# Patient Record
Sex: Female | Born: 1965 | Race: White | Hispanic: No
Health system: Southern US, Community
[De-identification: ages and names within clinical notes are randomized; demographics above are authoritative.]

---

## 2018-06-09 ENCOUNTER — Telehealth: Payer: Self-pay | Admitting: Nurse Practitioner

## 2018-06-09 ENCOUNTER — Other Ambulatory Visit: Payer: Self-pay | Admitting: Neurological Surgery

## 2018-06-09 DIAGNOSIS — M542 Cervicalgia: Secondary | ICD-10-CM

## 2018-06-09 NOTE — Telephone Encounter (Signed)
Phone call to patient to verify medication list and allergies for myelogram procedure. Pt instructed to stop Paxil 48hrs prior to myelogram appointment time. Pt verbalized understanding.

## 2018-06-23 ENCOUNTER — Ambulatory Visit
Admission: RE | Admit: 2018-06-23 | Discharge: 2018-06-23 | Disposition: A | Discharge status: Home / Self Care | Payer: Worker's Compensation | Source: Ambulatory Visit | Attending: Neurological Surgery | Admitting: Neurological Surgery

## 2018-06-23 DIAGNOSIS — M542 Cervicalgia: Secondary | ICD-10-CM

## 2018-06-23 MED ORDER — DIAZEPAM 5 MG PO TABS
5.0000 mg | ORAL_TABLET | Freq: Once | ORAL | Status: AC
  Administered 2018-06-23: 5 mg via ORAL

## 2018-06-23 MED ORDER — IOPAMIDOL (ISOVUE-M 300) INJECTION 61%
10.0000 mL | Freq: Once | INTRAMUSCULAR | Status: AC | PRN
  Administered 2018-06-23: 10 mL via INTRATHECAL

## 2018-06-23 MED ORDER — ACETAMINOPHEN 500 MG PO TABS
1000.0000 mg | ORAL_TABLET | Freq: Once | ORAL | Status: AC
  Administered 2018-06-23: 1000 mg via ORAL

## 2018-06-23 NOTE — Discharge Instructions (Signed)

## 2020-03-02 IMAGING — XA DG MYELOGRAPHY LUMBAR INJ CERVICAL
8 series · 8 of 8 positions shown · non-contrast
Comparison: CT cervical spine dated April 28, 2017. MRI cervical
spine dated March 11, 2017. There

CLINICAL DATA: Neck pain. Bilateral arm and hand weakness. Numbness
and tingling in both hands, possible carpal tunnel syndrome. Prior
C6-C7 disc replacement.
TECHNIQUE: Contiguous axial images were obtained through the cervical spine
after the intrathecal infusion of contrast. Coronal and sagittal
reconstructions were obtained of the axial image sets.

[Series 1: vasc standard · 1 of 1 slices shown (1 of 8)]
[im 1/1]
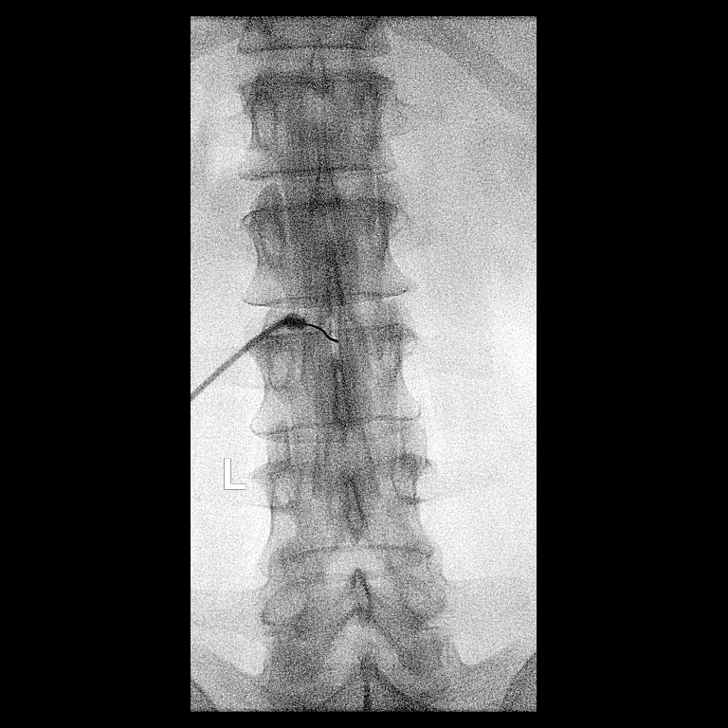

[Series 2: vasc standard · 1 of 1 slices shown (2 of 8)]
[im 1/1]
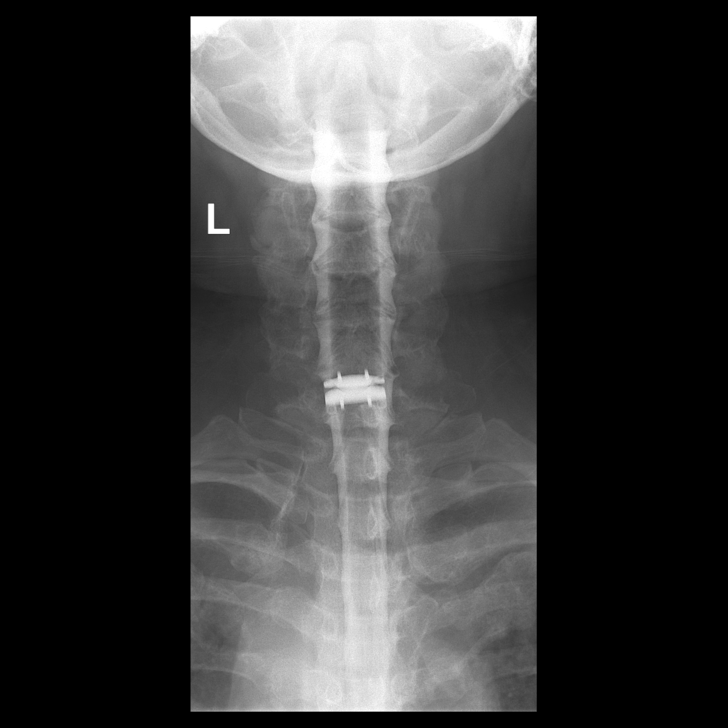

[Series 3: vasc standard · 1 of 1 slices shown (3 of 8)]
[im 1/1]
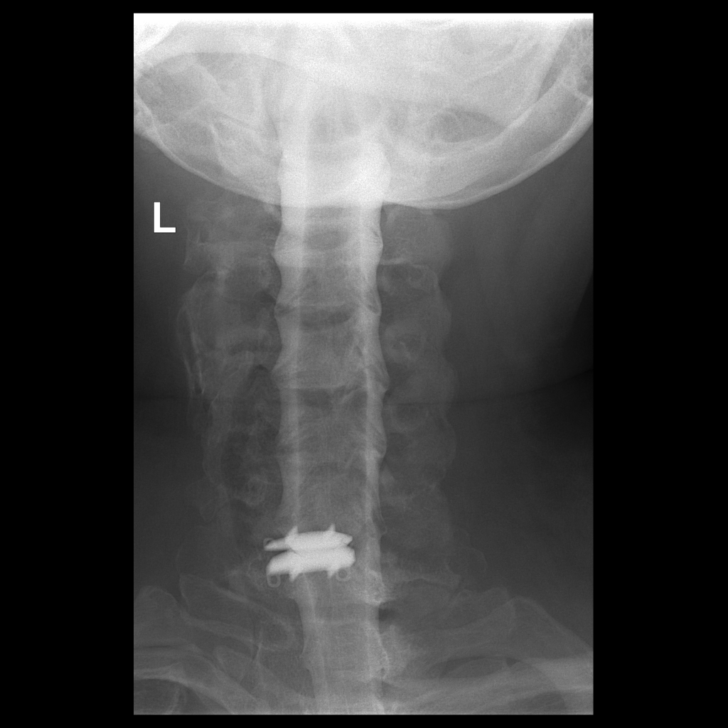

[Series 4: vasc standard · 1 of 1 slices shown (4 of 8)]
[im 1/1]
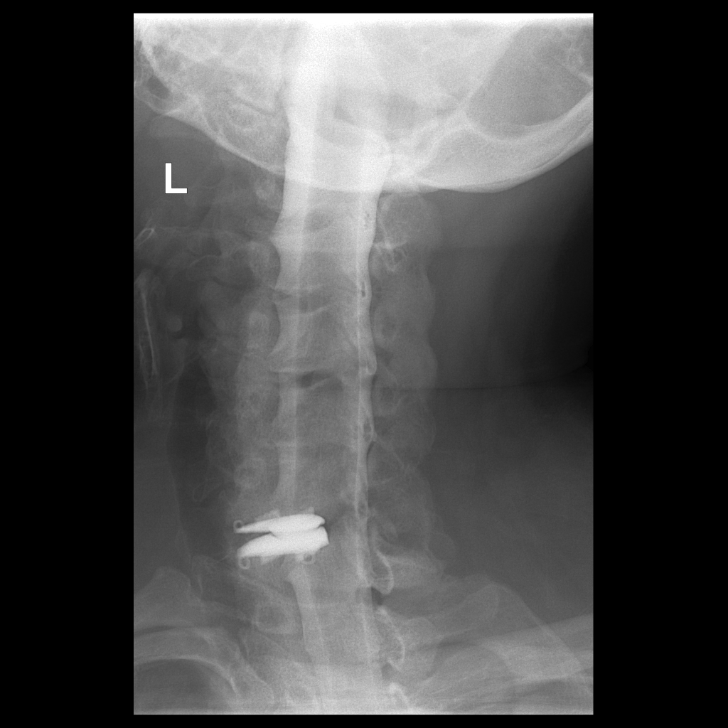

[Series 5: vasc standard · 1 of 1 slices shown (5 of 8)]
[im 1/1]
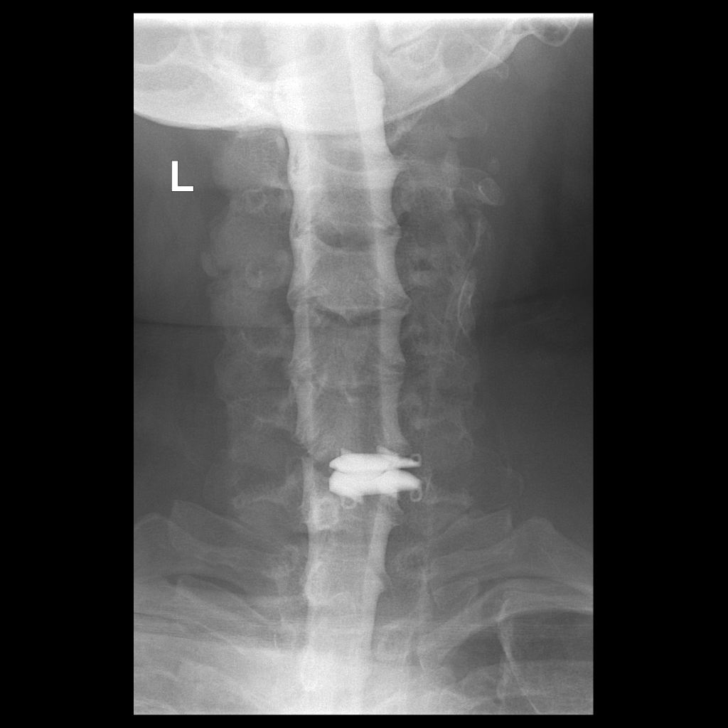

[Series 6: vasc standard · 1 of 1 slices shown (6 of 8)]
[im 1/1]
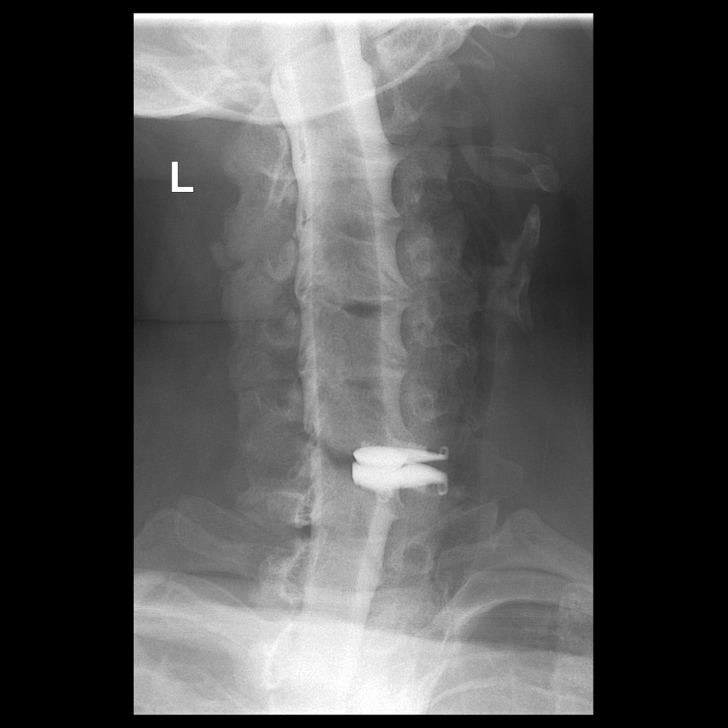

[Series 7: vasc standard · 1 of 1 slices shown (7 of 8)]
[im 1/1]
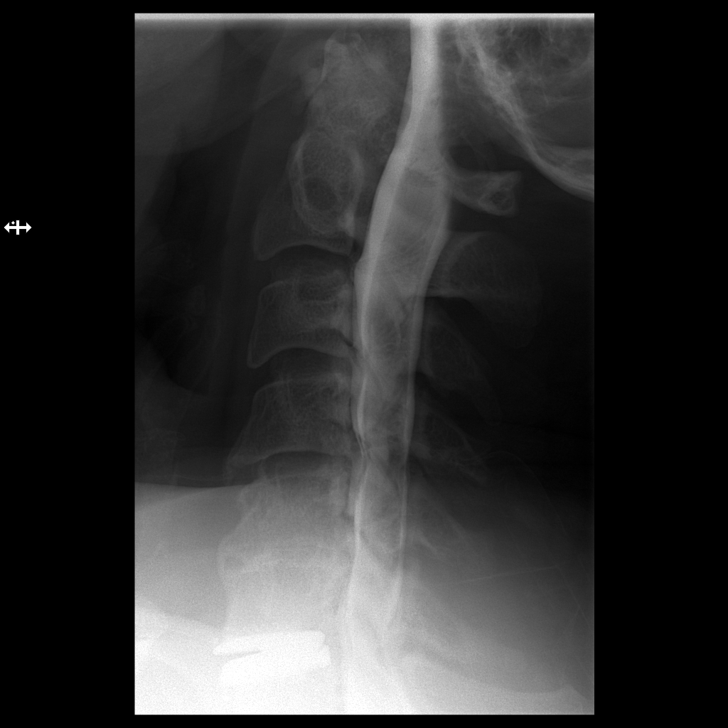

[Series 8: vasc standard · 1 of 1 slices shown (8 of 8)]
[im 1/1]
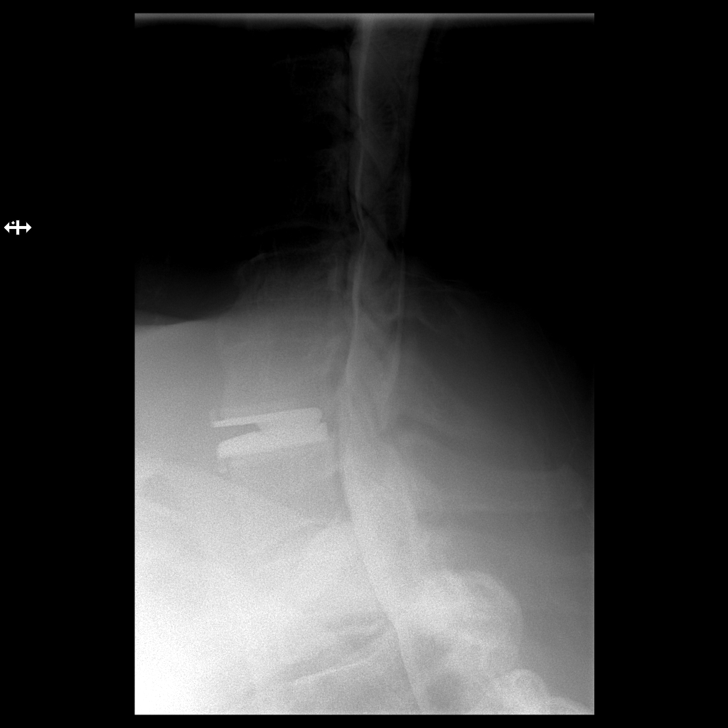

[8 of 8 positions shown; findings below may reference images not displayed]

EXAM:
CERVICAL MYELOGRAM

CT CERVICAL MYELOGRAM

FLUOROSCOPY TIME:  Radiation Exposure Index (as provided by the
fluoroscopic device): 5 mGy

Fluoroscopy Time:  16 seconds

Number of Acquired Images:  7

PROCEDURE:
LUMBAR PUNCTURE FOR CERVICAL MYELOGRAM

After thorough discussion of risks and benefits of the procedure
including bleeding, infection, injury to nerves, blood vessels,
adjacent structures as well as headache and CSF leak, written and
oral informed consent was obtained. Consent was obtained by Dr.
Phiil Tuelo. We discussed the high likelihood of obtaining a
diagnostic study.

Patient was positioned prone on the fluoroscopy table. Local
anesthesia was provided with 1% lidocaine without epinephrine after
prepped and draped in the usual sterile fashion. Puncture was
performed at L2-L3 using a 5 inch 22-gauge spinal needle via left
paramedian approach. Using a single pass through the dura, the
needle was placed within the thecal sac, with return of clear CSF.
10 mL of Isovue Y-LRR was injected into the thecal sac, with normal
opacification of the nerve roots and cauda equina consistent with
free flow within the subarachnoid space. The patient was then moved
to the trendelenburg position and contrast flowed into the cervical
spine region.

I personally performed the lumbar puncture and administered the
intrathecal contrast. I also personally supervised acquisition of
the myelogram images.
FINDINGS: CERVICAL MYELOGRAM FINDINGS:

Prior C6-C7 disc replacement. There is no acute fracture or
subluxation. Vertebral body heights are preserved. Alignment is
normal. Interval auto-fusion of the C5-C6 disc space. Mild disc
height loss and small ventral extradural defect at C4-C5. Mild
spinal canal stenosis at C4-C5 and C5-C6. Effacement of the exiting
left C6 nerve root. Normal prevertebral soft tissues.

CT CERVICAL MYELOGRAM FINDINGS:

Alignment: Slight reversal of the normal cervical lordosis at C4-C5,
unchanged. Sagittal alignment is maintained.

Skull base and vertebrae: No acute fracture or focal pathologic
process.

Cord: Normal in morphology.

Soft tissues and upper chest: Negative.

Disc levels:

C2-C3:  Negative.

C3-C4: Negative disc. Mild left facet uncovertebral hypertrophy.
Mild left neuroforaminal stenosis. No spinal canal or right
neuroforaminal stenosis.

C4-C5: Small posterior disc osteophyte complex and mild left greater
than right uncovertebral hypertrophy. Effacement of the ventral
thecal sac with slight flattening of the ventral cord and mild
central spinal canal stenosis. Mild left neuroforaminal stenosis. No
right neuroforaminal stenosis. Findings are similar to prior MRI.

C5-C6: Interval auto fusion of the disc space and uncovertebral
joints. Posterior endplate spurring and mild left facet arthropathy.
Effacement of the ventral thecal sac with slight flattening of the
ventral cord and mild central spinal canal stenosis. Moderate left
neuroforaminal stenosis. No right neuroforaminal stenosis. Findings
are similar to prior MRI.

C6-C7: Prior disc replacement. No evidence of hardware failure or
loosening. No subsidence. Effacement of the ventral thecal sac with
slight flattening of the ventral cord and mild residual central
spinal canal stenosis. No neuroforaminal stenosis.

C7-T1:  Mild bilateral facet arthropathy.  No stenosis.
IMPRESSION: 1. Interval auto fusion of the C5-C6 disc space and uncovertebral
joints. Unchanged mild central spinal canal stenosis and moderate
left neuroforaminal stenosis at C5-C6.
2. Unchanged mild central spinal canal stenosis and mild left
neuroforaminal stenosis at C4-C5.
3. Prior C6-C7 disc replacement without evidence of hardware
complication. Mild residual central spinal canal stenosis.
